# Patient Record
Sex: Female | Born: 1950 | Race: White | Hispanic: No | Smoking: Never smoker
Health system: Southern US, Community
[De-identification: ages and names within clinical notes are randomized; demographics above are authoritative.]

## PROBLEM LIST (undated history)

## (undated) HISTORY — PX: ABDOMINAL HYSTERECTOMY: SHX81

---

## 2004-04-13 ENCOUNTER — Ambulatory Visit: Payer: Self-pay | Admitting: Obstetrics and Gynecology

## 2005-04-27 ENCOUNTER — Ambulatory Visit: Payer: Self-pay | Admitting: Obstetrics and Gynecology

## 2005-08-11 ENCOUNTER — Ambulatory Visit: Payer: Self-pay | Admitting: Gastroenterology

## 2006-05-01 ENCOUNTER — Ambulatory Visit: Payer: Self-pay | Admitting: Obstetrics and Gynecology

## 2007-05-08 ENCOUNTER — Ambulatory Visit: Payer: Self-pay | Admitting: Obstetrics and Gynecology

## 2008-05-13 ENCOUNTER — Ambulatory Visit: Payer: Self-pay | Admitting: Obstetrics and Gynecology

## 2009-05-18 ENCOUNTER — Ambulatory Visit: Payer: Self-pay | Admitting: Obstetrics and Gynecology

## 2010-05-25 ENCOUNTER — Ambulatory Visit: Payer: Self-pay | Admitting: Obstetrics and Gynecology

## 2011-06-15 ENCOUNTER — Ambulatory Visit: Payer: Self-pay | Admitting: Obstetrics and Gynecology

## 2012-06-21 ENCOUNTER — Ambulatory Visit: Payer: Self-pay | Admitting: Obstetrics and Gynecology

## 2013-06-26 ENCOUNTER — Ambulatory Visit: Payer: Self-pay | Admitting: Obstetrics and Gynecology

## 2013-12-13 ENCOUNTER — Ambulatory Visit: Payer: Self-pay | Admitting: Orthopedic Surgery

## 2014-04-03 ENCOUNTER — Ambulatory Visit: Payer: Self-pay | Admitting: Orthopedic Surgery

## 2014-07-01 ENCOUNTER — Ambulatory Visit: Payer: Self-pay | Admitting: Obstetrics and Gynecology

## 2014-10-11 NOTE — Op Note (Signed)
PATIENT NAME:  Vanessa Underwood, Vanessa Underwood MR#:  [id number] DATE OF BIRTH:  08-31-1950  DATE OF PROCEDURE:  04/03/2014  PREOPERATIVE DIAGNOSIS: Painful left wrist volar hardware.   POSTOPERATIVE DIAGNOSIS: Painful left wrist volar hardware.   PROCEDURE: Removal of deep hardware left wrist.   ANESTHESIA: General.   SURGEON: Kennedy BuckerMichael Wynette Jersey, MD.   DESCRIPTION OF PROCEDURE: The patient was brought to the operating room and after adequate anesthesia was obtained the left arm was prepped and draped in the usual sterile fashion. After patient identification and timeout procedures were completed the tourniquet was raised to 250 mmHg. The prior skin incision was opened and the subcutaneous tissues spread. The FCR tendon was identified and retracted radially, protecting the neurovascular bundle. Deep dissection was carried out and the pronator was elevated off the plate. There was a layer of bursitis that was consistent with chronic inflammation overlying the volar radial plate. The proximal screws were removed without difficulty and then the smooth pegs from the distal portion of the plate removed again without any difficulty. The plate was grasped and removed again without difficulty with bursa noted over the plate.  The wound was irrigated and tourniquet let down. The wound was then closed with 4-0 Vicryl subcutaneous and 4-0 nylon for the skin. Xeroform, 4 x 4s, Webril and an Ace wrap applied and the patient was sent to the recovery room in stable condition.   ESTIMATED BLOOD LOSS: Minimal.   COMPLICATIONS: None.   SPECIMEN: None.  The patient did not request hardware and the hardware was intact.   TOURNIQUET TIME: 11 minutes at 250 mmHg.    ____________________________ Leitha SchullerMichael J. Lynasia Meloche, MD mjm:bu D: 04/03/2014 16:44:01 ET T: 04/03/2014 17:16:50 ET JOB#: [id number]  cc: Leitha SchullerMichael J. Ledon Weihe, MD, <Dictator> Leitha SchullerMICHAEL J Anthonee Gelin MD ELECTRONICALLY SIGNED 04/03/2014 22:21

## 2014-10-11 NOTE — Op Note (Signed)
PATIENT NAME:  Vanessa Underwood, Vanessa Underwood MR#:  [id number] DATE OF BIRTH:  1951-01-28  DATE OF PROCEDURE:  12/13/2013  PREOPERATIVE DIAGNOSIS: Comminuted intraarticular fracture of the left distal radius.   POSTOPERATIVE DIAGNOSIS: Comminuted intraarticular fracture of the left distal radius.   PROCEDURE: Open reduction and internal fixation, left distal radius.   ANESTHESIA: General.   SURGEON: Leitha SchullerMichael J. Menz, MD  DESCRIPTION OF PROCEDURE: The patient was brought to the operating room and after adequate anesthesia was obtained, the left arm was prepped and draped in the usual sterile fashion with a tourniquet applied to the upper arm. After appropriate patient identification and timeout procedures were completed, fingertrap traction was applied through the index and middle finger with 9-3/4 pounds of traction applied. The tourniquet was raised.  After inflation of the tourniquet, a volar incision was made in line with the FCR tendon. Hemostasis was achieved with electrocautery. After initial approach, there appeared to be inadequate tourniquet effect, so the tourniquet was raised to 275. This still did not seem to give a good enough effect, so the tourniquet was let down during the initial dissection. The FCR tendon sheath was incised, the tendon retracted radially to protect the radial artery, and the pronator elevated off the distal radius. The exposure was quite comminuted. At this point, the tourniquet was raised back up for better visualization of the fragments. With many C-arm views, the reduction was mostly completed with just traction with flexion. There was a near anatomic reduction.   A short narrow DVR plate was applied and pinned in position to get appropriate rotation and position with regard to being distal enough. Two cortical screws were placed in the proximal shaft to hold the plate in place. With the wrist flexed with traction still applied, the distal peg holes were filled, drilling,  measuring and placing smooth pegs. After all smooth pegs were placed, traction was released. Through range of motion, the fracture appeared stable.   The tourniquet was let down and bleeding was controlled with electrocautery. There was minimal bleeding. The wound was thoroughly irrigated and then closed with 3-0 Vicryl subcutaneously, 4-0 nylon for the skin in a simple interrupted manner. Xeroform, 4 x 4's, Webril, and a volar splint were applied followed by an Ace wrap. Tourniquet time was 23 minutes at 275 mmHg. There were no complications.   SPECIMEN: None.   CONDITION: To the recovery room, stable.   IMPLANT: [employer] left short narrow DVR plate with multiple screws and smooth pegs.    ____________________________ Leitha SchullerMichael J. Menz, MD mjm:ah D: 12/14/2013 09:29:42 ET T: 12/14/2013 20:05:57 ET JOB#: [id number]  cc: Leitha SchullerMichael J. Menz, MD, <Dictator> Leitha SchullerMICHAEL J MENZ MD ELECTRONICALLY SIGNED 12/15/2013 7:49

## 2015-07-07 ENCOUNTER — Other Ambulatory Visit: Payer: Self-pay | Admitting: Obstetrics and Gynecology

## 2015-07-07 DIAGNOSIS — Z01411 Encounter for gynecological examination (general) (routine) with abnormal findings: Secondary | ICD-10-CM | POA: Diagnosis not present

## 2015-07-07 DIAGNOSIS — Z1231 Encounter for screening mammogram for malignant neoplasm of breast: Secondary | ICD-10-CM | POA: Diagnosis not present

## 2015-07-07 DIAGNOSIS — L900 Lichen sclerosus et atrophicus: Secondary | ICD-10-CM | POA: Diagnosis not present

## 2015-07-14 ENCOUNTER — Ambulatory Visit
Admission: RE | Admit: 2015-07-14 | Discharge: 2015-07-14 | Disposition: A | Discharge status: Home / Self Care | Payer: PPO | Source: Ambulatory Visit | Attending: Obstetrics and Gynecology | Admitting: Obstetrics and Gynecology

## 2015-07-14 ENCOUNTER — Other Ambulatory Visit: Payer: Self-pay | Admitting: Obstetrics and Gynecology

## 2015-07-14 DIAGNOSIS — Z1231 Encounter for screening mammogram for malignant neoplasm of breast: Secondary | ICD-10-CM

## 2015-08-06 IMAGING — CR LEFT WRIST - 2 VIEW
1 series · 2 of 2 positions shown · non-contrast
Comparison: None

CLINICAL DATA: Post ORIF

EXAM:
LEFT WRIST - 2 VIEW

[Series 1: pa · 0.17mm/px · 2 of 2 slices shown]
[im 1/2]
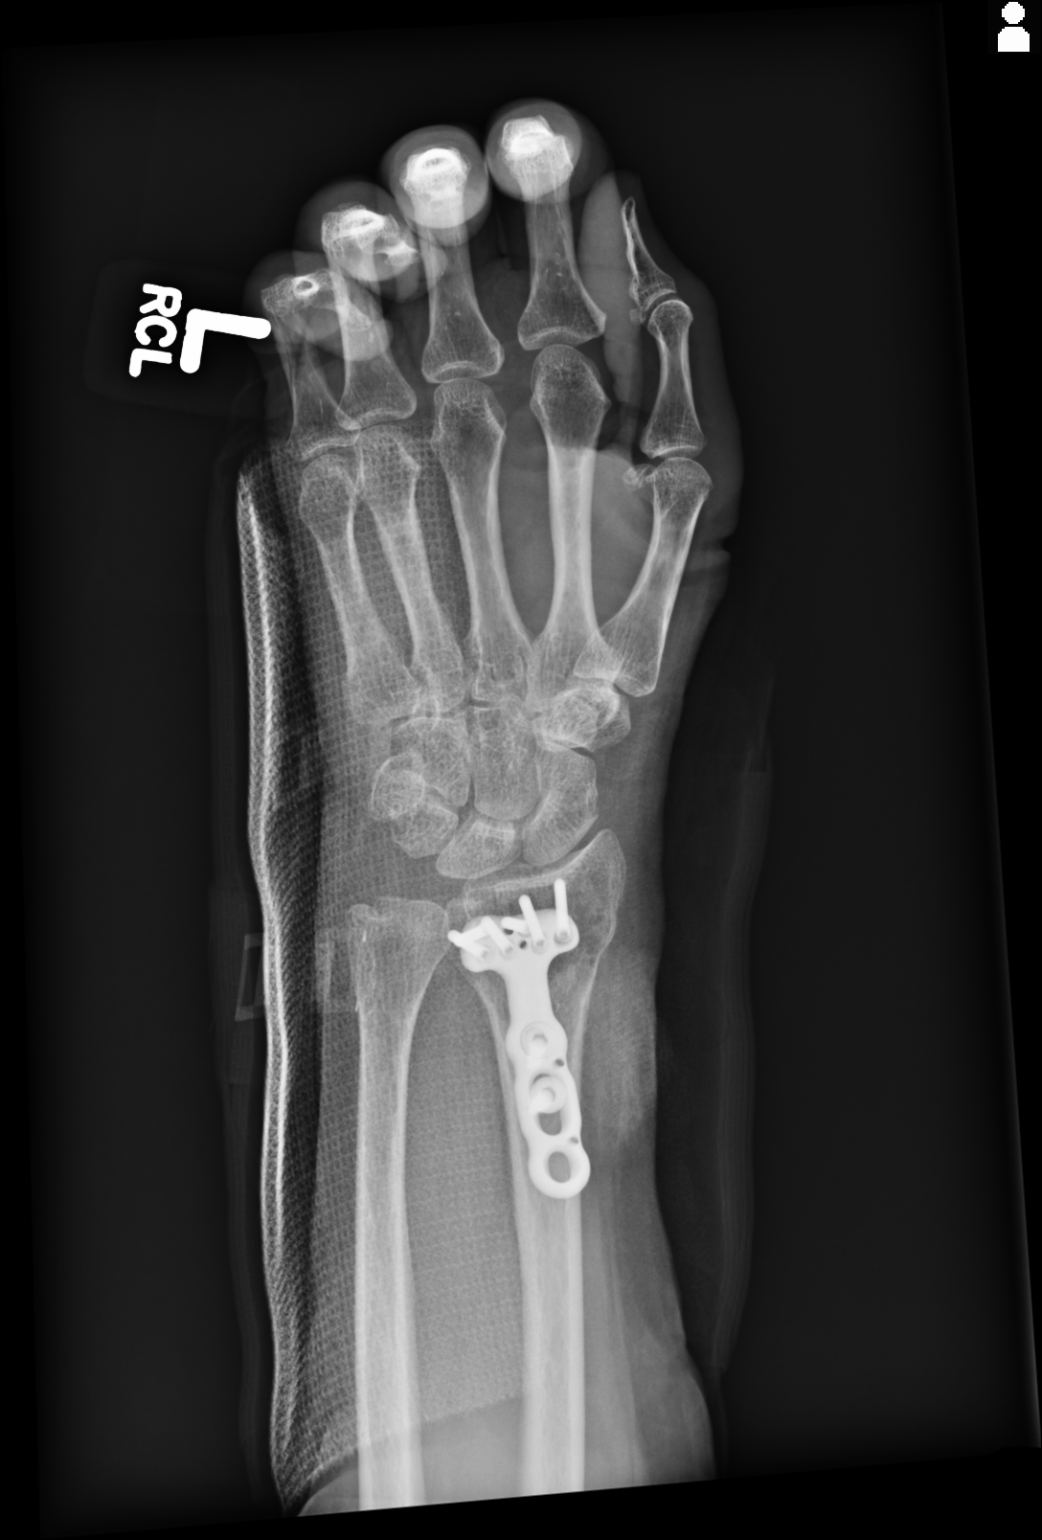
[im 2/2]
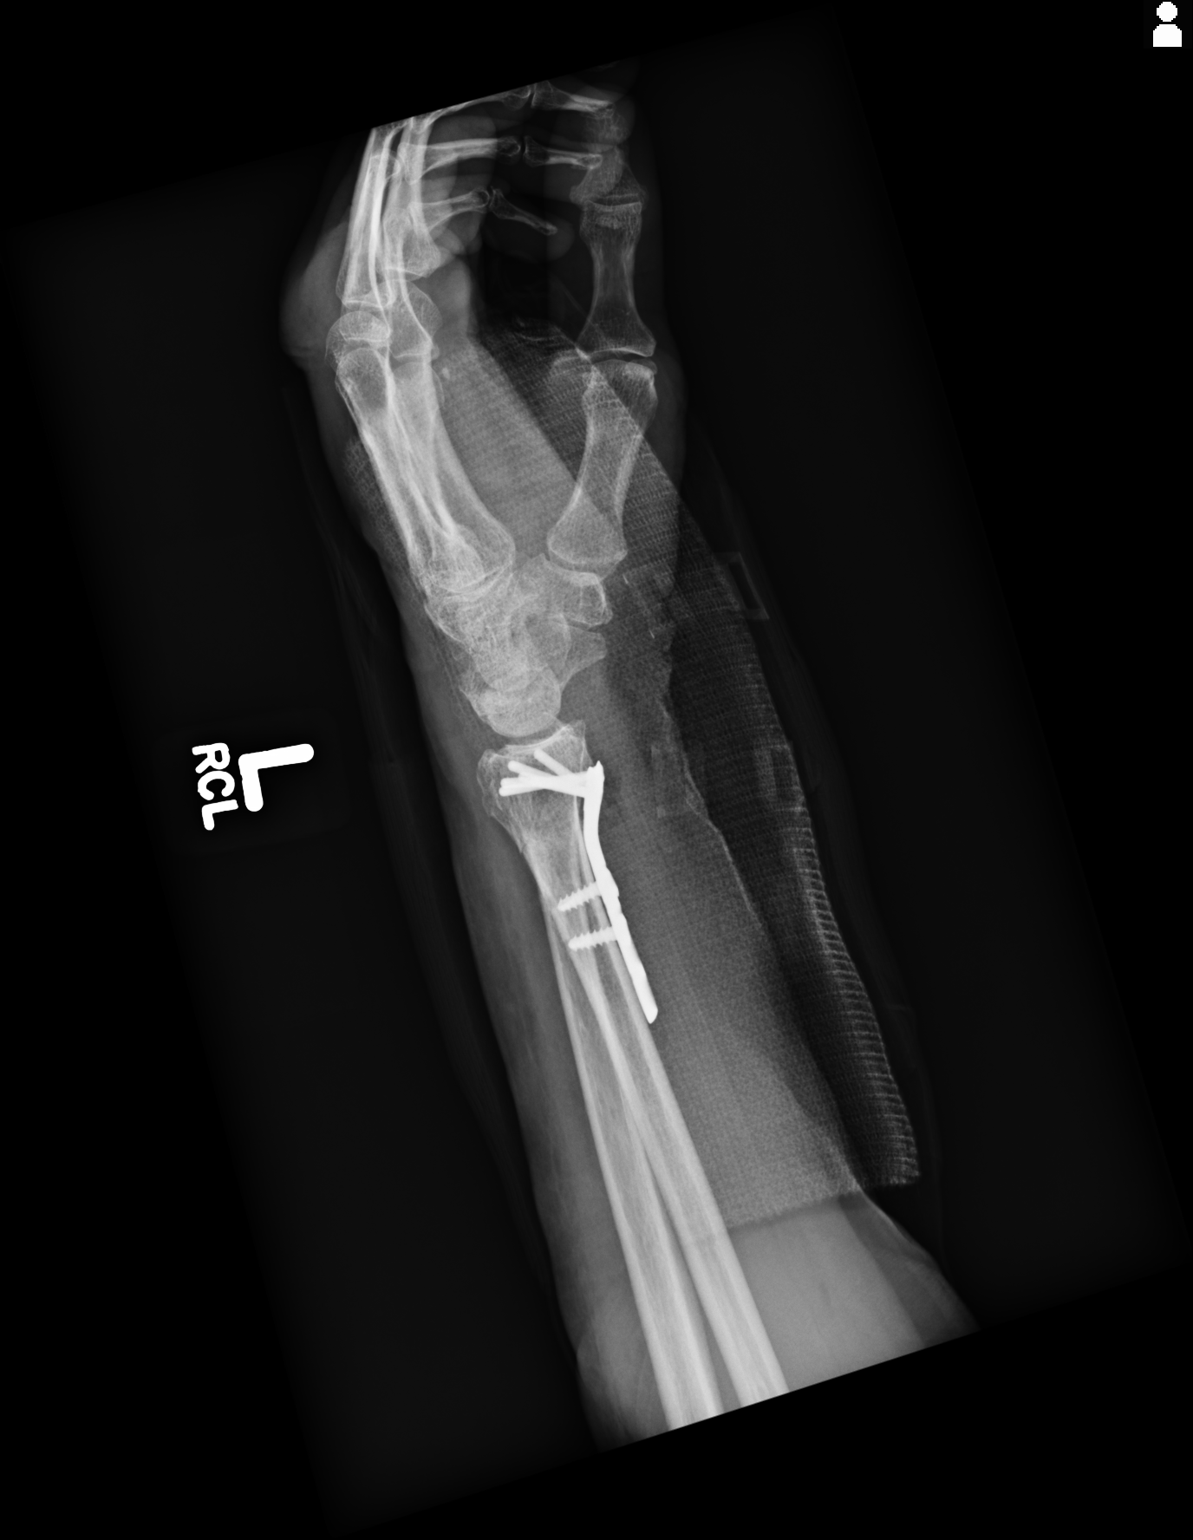

[2 of 2 positions shown; findings below may reference images not displayed]

FINDINGS: No preoperative images for comparison.

Fiberglass splint material obscures bone detail.

Volar plate and multiple screws/pegs at distal LEFT radius post ORIF
of a reduced distal LEFT radial metaphyseal fracture.

Bones appear demineralized.

Joint spaces preserved.

No additional fracture or dislocation seen.
IMPRESSION: Post ORIF of a distal LEFT radial metaphyseal fracture.

## 2015-09-25 DIAGNOSIS — F3342 Major depressive disorder, recurrent, in full remission: Secondary | ICD-10-CM | POA: Diagnosis not present

## 2015-09-30 DIAGNOSIS — E034 Atrophy of thyroid (acquired): Secondary | ICD-10-CM | POA: Diagnosis not present

## 2015-09-30 DIAGNOSIS — E784 Other hyperlipidemia: Secondary | ICD-10-CM | POA: Diagnosis not present

## 2015-09-30 DIAGNOSIS — M150 Primary generalized (osteo)arthritis: Secondary | ICD-10-CM | POA: Diagnosis not present

## 2015-09-30 DIAGNOSIS — R739 Hyperglycemia, unspecified: Secondary | ICD-10-CM | POA: Diagnosis not present

## 2015-09-30 DIAGNOSIS — E559 Vitamin D deficiency, unspecified: Secondary | ICD-10-CM | POA: Diagnosis not present

## 2015-10-07 DIAGNOSIS — K648 Other hemorrhoids: Secondary | ICD-10-CM | POA: Diagnosis not present

## 2015-10-07 DIAGNOSIS — E559 Vitamin D deficiency, unspecified: Secondary | ICD-10-CM | POA: Diagnosis not present

## 2015-10-07 DIAGNOSIS — R739 Hyperglycemia, unspecified: Secondary | ICD-10-CM | POA: Diagnosis not present

## 2015-10-07 DIAGNOSIS — Z1211 Encounter for screening for malignant neoplasm of colon: Secondary | ICD-10-CM | POA: Diagnosis not present

## 2015-10-07 DIAGNOSIS — M150 Primary generalized (osteo)arthritis: Secondary | ICD-10-CM | POA: Diagnosis not present

## 2015-10-07 DIAGNOSIS — E784 Other hyperlipidemia: Secondary | ICD-10-CM | POA: Diagnosis not present

## 2015-10-07 DIAGNOSIS — M810 Age-related osteoporosis without current pathological fracture: Secondary | ICD-10-CM | POA: Diagnosis not present

## 2015-10-07 DIAGNOSIS — Z23 Encounter for immunization: Secondary | ICD-10-CM | POA: Diagnosis not present

## 2015-10-07 DIAGNOSIS — F3342 Major depressive disorder, recurrent, in full remission: Secondary | ICD-10-CM | POA: Diagnosis not present

## 2015-10-07 DIAGNOSIS — E034 Atrophy of thyroid (acquired): Secondary | ICD-10-CM | POA: Diagnosis not present

## 2015-10-15 DIAGNOSIS — M810 Age-related osteoporosis without current pathological fracture: Secondary | ICD-10-CM | POA: Diagnosis not present

## 2015-10-19 DIAGNOSIS — Z1211 Encounter for screening for malignant neoplasm of colon: Secondary | ICD-10-CM | POA: Diagnosis not present

## 2015-10-20 DIAGNOSIS — L900 Lichen sclerosus et atrophicus: Secondary | ICD-10-CM | POA: Diagnosis not present

## 2015-12-24 DIAGNOSIS — L03116 Cellulitis of left lower limb: Secondary | ICD-10-CM | POA: Diagnosis not present

## 2016-01-19 DIAGNOSIS — L900 Lichen sclerosus et atrophicus: Secondary | ICD-10-CM | POA: Diagnosis not present

## 2016-02-12 DIAGNOSIS — R399 Unspecified symptoms and signs involving the genitourinary system: Secondary | ICD-10-CM | POA: Diagnosis not present

## 2016-03-25 DIAGNOSIS — F3342 Major depressive disorder, recurrent, in full remission: Secondary | ICD-10-CM | POA: Diagnosis not present

## 2016-04-05 DIAGNOSIS — K648 Other hemorrhoids: Secondary | ICD-10-CM | POA: Diagnosis not present

## 2016-04-05 DIAGNOSIS — M150 Primary generalized (osteo)arthritis: Secondary | ICD-10-CM | POA: Diagnosis not present

## 2016-04-05 DIAGNOSIS — R739 Hyperglycemia, unspecified: Secondary | ICD-10-CM | POA: Diagnosis not present

## 2016-04-05 DIAGNOSIS — E034 Atrophy of thyroid (acquired): Secondary | ICD-10-CM | POA: Diagnosis not present

## 2016-04-05 DIAGNOSIS — F3342 Major depressive disorder, recurrent, in full remission: Secondary | ICD-10-CM | POA: Diagnosis not present

## 2016-04-05 DIAGNOSIS — E784 Other hyperlipidemia: Secondary | ICD-10-CM | POA: Diagnosis not present

## 2016-04-19 DIAGNOSIS — E034 Atrophy of thyroid (acquired): Secondary | ICD-10-CM | POA: Diagnosis not present

## 2016-04-19 DIAGNOSIS — M810 Age-related osteoporosis without current pathological fracture: Secondary | ICD-10-CM | POA: Diagnosis not present

## 2016-04-19 DIAGNOSIS — R739 Hyperglycemia, unspecified: Secondary | ICD-10-CM | POA: Diagnosis not present

## 2016-04-19 DIAGNOSIS — Z1211 Encounter for screening for malignant neoplasm of colon: Secondary | ICD-10-CM | POA: Diagnosis not present

## 2016-04-19 DIAGNOSIS — E784 Other hyperlipidemia: Secondary | ICD-10-CM | POA: Diagnosis not present

## 2016-04-19 DIAGNOSIS — Z1212 Encounter for screening for malignant neoplasm of rectum: Secondary | ICD-10-CM | POA: Diagnosis not present

## 2016-04-19 DIAGNOSIS — F3342 Major depressive disorder, recurrent, in full remission: Secondary | ICD-10-CM | POA: Diagnosis not present

## 2016-04-19 DIAGNOSIS — M150 Primary generalized (osteo)arthritis: Secondary | ICD-10-CM | POA: Diagnosis not present

## 2016-04-19 DIAGNOSIS — E559 Vitamin D deficiency, unspecified: Secondary | ICD-10-CM | POA: Diagnosis not present

## 2016-04-19 DIAGNOSIS — Z0001 Encounter for general adult medical examination with abnormal findings: Secondary | ICD-10-CM | POA: Diagnosis not present

## 2016-04-19 DIAGNOSIS — Z79899 Other long term (current) drug therapy: Secondary | ICD-10-CM | POA: Diagnosis not present

## 2016-05-02 DIAGNOSIS — Z1212 Encounter for screening for malignant neoplasm of rectum: Secondary | ICD-10-CM | POA: Diagnosis not present

## 2016-05-02 DIAGNOSIS — Z1211 Encounter for screening for malignant neoplasm of colon: Secondary | ICD-10-CM | POA: Diagnosis not present

## 2016-09-30 DIAGNOSIS — F3342 Major depressive disorder, recurrent, in full remission: Secondary | ICD-10-CM | POA: Diagnosis not present

## 2016-10-04 DIAGNOSIS — E559 Vitamin D deficiency, unspecified: Secondary | ICD-10-CM | POA: Diagnosis not present

## 2016-10-04 DIAGNOSIS — E034 Atrophy of thyroid (acquired): Secondary | ICD-10-CM | POA: Diagnosis not present

## 2016-10-04 DIAGNOSIS — E784 Other hyperlipidemia: Secondary | ICD-10-CM | POA: Diagnosis not present

## 2016-10-04 DIAGNOSIS — M150 Primary generalized (osteo)arthritis: Secondary | ICD-10-CM | POA: Diagnosis not present

## 2016-10-04 DIAGNOSIS — R739 Hyperglycemia, unspecified: Secondary | ICD-10-CM | POA: Diagnosis not present

## 2016-10-11 DIAGNOSIS — R739 Hyperglycemia, unspecified: Secondary | ICD-10-CM | POA: Diagnosis not present

## 2016-10-11 DIAGNOSIS — F3342 Major depressive disorder, recurrent, in full remission: Secondary | ICD-10-CM | POA: Diagnosis not present

## 2016-10-11 DIAGNOSIS — Z0000 Encounter for general adult medical examination without abnormal findings: Secondary | ICD-10-CM | POA: Diagnosis not present

## 2016-10-11 DIAGNOSIS — M810 Age-related osteoporosis without current pathological fracture: Secondary | ICD-10-CM | POA: Diagnosis not present

## 2016-10-11 DIAGNOSIS — E559 Vitamin D deficiency, unspecified: Secondary | ICD-10-CM | POA: Diagnosis not present

## 2016-10-11 DIAGNOSIS — Z1212 Encounter for screening for malignant neoplasm of rectum: Secondary | ICD-10-CM | POA: Diagnosis not present

## 2016-10-11 DIAGNOSIS — Z1211 Encounter for screening for malignant neoplasm of colon: Secondary | ICD-10-CM | POA: Diagnosis not present

## 2016-10-11 DIAGNOSIS — E784 Other hyperlipidemia: Secondary | ICD-10-CM | POA: Diagnosis not present

## 2016-10-11 DIAGNOSIS — E034 Atrophy of thyroid (acquired): Secondary | ICD-10-CM | POA: Diagnosis not present

## 2016-10-11 DIAGNOSIS — M150 Primary generalized (osteo)arthritis: Secondary | ICD-10-CM | POA: Diagnosis not present

## 2016-10-19 DIAGNOSIS — Z1211 Encounter for screening for malignant neoplasm of colon: Secondary | ICD-10-CM | POA: Diagnosis not present

## 2016-10-19 DIAGNOSIS — Z1212 Encounter for screening for malignant neoplasm of rectum: Secondary | ICD-10-CM | POA: Diagnosis not present

## 2017-04-07 DIAGNOSIS — F3342 Major depressive disorder, recurrent, in full remission: Secondary | ICD-10-CM | POA: Diagnosis not present

## 2017-04-14 DIAGNOSIS — M150 Primary generalized (osteo)arthritis: Secondary | ICD-10-CM | POA: Diagnosis not present

## 2017-04-14 DIAGNOSIS — R739 Hyperglycemia, unspecified: Secondary | ICD-10-CM | POA: Diagnosis not present

## 2017-04-14 DIAGNOSIS — E034 Atrophy of thyroid (acquired): Secondary | ICD-10-CM | POA: Diagnosis not present

## 2017-04-14 DIAGNOSIS — E7849 Other hyperlipidemia: Secondary | ICD-10-CM | POA: Diagnosis not present

## 2017-04-21 DIAGNOSIS — R739 Hyperglycemia, unspecified: Secondary | ICD-10-CM | POA: Diagnosis not present

## 2017-04-21 DIAGNOSIS — F3342 Major depressive disorder, recurrent, in full remission: Secondary | ICD-10-CM | POA: Diagnosis not present

## 2017-04-21 DIAGNOSIS — E034 Atrophy of thyroid (acquired): Secondary | ICD-10-CM | POA: Diagnosis not present

## 2017-04-21 DIAGNOSIS — E559 Vitamin D deficiency, unspecified: Secondary | ICD-10-CM | POA: Diagnosis not present

## 2017-04-21 DIAGNOSIS — M150 Primary generalized (osteo)arthritis: Secondary | ICD-10-CM | POA: Diagnosis not present

## 2017-04-21 DIAGNOSIS — M810 Age-related osteoporosis without current pathological fracture: Secondary | ICD-10-CM | POA: Diagnosis not present

## 2017-04-21 DIAGNOSIS — E7849 Other hyperlipidemia: Secondary | ICD-10-CM | POA: Diagnosis not present

## 2017-06-06 ENCOUNTER — Other Ambulatory Visit: Payer: Self-pay | Admitting: Obstetrics and Gynecology

## 2017-06-06 DIAGNOSIS — Z01411 Encounter for gynecological examination (general) (routine) with abnormal findings: Secondary | ICD-10-CM | POA: Diagnosis not present

## 2017-06-06 DIAGNOSIS — Z1231 Encounter for screening mammogram for malignant neoplasm of breast: Secondary | ICD-10-CM | POA: Diagnosis not present

## 2017-07-10 ENCOUNTER — Ambulatory Visit
Admission: RE | Admit: 2017-07-10 | Discharge: 2017-07-10 | Disposition: A | Discharge status: Home / Self Care | Payer: PPO | Source: Ambulatory Visit | Attending: Obstetrics and Gynecology | Admitting: Obstetrics and Gynecology

## 2017-07-10 DIAGNOSIS — R928 Other abnormal and inconclusive findings on diagnostic imaging of breast: Secondary | ICD-10-CM | POA: Insufficient documentation

## 2017-07-10 DIAGNOSIS — Z1231 Encounter for screening mammogram for malignant neoplasm of breast: Secondary | ICD-10-CM | POA: Diagnosis not present

## 2017-07-17 ENCOUNTER — Other Ambulatory Visit: Payer: Self-pay | Admitting: Obstetrics and Gynecology

## 2017-07-17 DIAGNOSIS — N6489 Other specified disorders of breast: Secondary | ICD-10-CM

## 2017-07-17 DIAGNOSIS — R928 Other abnormal and inconclusive findings on diagnostic imaging of breast: Secondary | ICD-10-CM

## 2017-09-29 DIAGNOSIS — F3342 Major depressive disorder, recurrent, in full remission: Secondary | ICD-10-CM | POA: Diagnosis not present

## 2017-10-13 DIAGNOSIS — M150 Primary generalized (osteo)arthritis: Secondary | ICD-10-CM | POA: Diagnosis not present

## 2017-10-13 DIAGNOSIS — E559 Vitamin D deficiency, unspecified: Secondary | ICD-10-CM | POA: Diagnosis not present

## 2017-10-13 DIAGNOSIS — M810 Age-related osteoporosis without current pathological fracture: Secondary | ICD-10-CM | POA: Diagnosis not present

## 2017-10-13 DIAGNOSIS — E7849 Other hyperlipidemia: Secondary | ICD-10-CM | POA: Diagnosis not present

## 2017-10-13 DIAGNOSIS — R739 Hyperglycemia, unspecified: Secondary | ICD-10-CM | POA: Diagnosis not present

## 2017-10-20 DIAGNOSIS — E034 Atrophy of thyroid (acquired): Secondary | ICD-10-CM | POA: Diagnosis not present

## 2017-10-20 DIAGNOSIS — E7849 Other hyperlipidemia: Secondary | ICD-10-CM | POA: Diagnosis not present

## 2017-10-20 DIAGNOSIS — E559 Vitamin D deficiency, unspecified: Secondary | ICD-10-CM | POA: Diagnosis not present

## 2017-10-20 DIAGNOSIS — Z1211 Encounter for screening for malignant neoplasm of colon: Secondary | ICD-10-CM | POA: Diagnosis not present

## 2017-10-20 DIAGNOSIS — R739 Hyperglycemia, unspecified: Secondary | ICD-10-CM | POA: Diagnosis not present

## 2017-10-20 DIAGNOSIS — Z0000 Encounter for general adult medical examination without abnormal findings: Secondary | ICD-10-CM | POA: Diagnosis not present

## 2017-10-20 DIAGNOSIS — D72810 Lymphocytopenia: Secondary | ICD-10-CM | POA: Diagnosis not present

## 2017-10-20 DIAGNOSIS — M810 Age-related osteoporosis without current pathological fracture: Secondary | ICD-10-CM | POA: Diagnosis not present

## 2017-10-20 DIAGNOSIS — F3342 Major depressive disorder, recurrent, in full remission: Secondary | ICD-10-CM | POA: Diagnosis not present

## 2017-10-20 DIAGNOSIS — M150 Primary generalized (osteo)arthritis: Secondary | ICD-10-CM | POA: Diagnosis not present

## 2017-10-26 DIAGNOSIS — Z1211 Encounter for screening for malignant neoplasm of colon: Secondary | ICD-10-CM | POA: Diagnosis not present

## 2018-03-23 DIAGNOSIS — F3342 Major depressive disorder, recurrent, in full remission: Secondary | ICD-10-CM | POA: Diagnosis not present

## 2018-04-17 DIAGNOSIS — Z79899 Other long term (current) drug therapy: Secondary | ICD-10-CM | POA: Diagnosis not present

## 2018-04-17 DIAGNOSIS — D72810 Lymphocytopenia: Secondary | ICD-10-CM | POA: Diagnosis not present

## 2018-04-17 DIAGNOSIS — R739 Hyperglycemia, unspecified: Secondary | ICD-10-CM | POA: Diagnosis not present

## 2018-04-17 DIAGNOSIS — E7849 Other hyperlipidemia: Secondary | ICD-10-CM | POA: Diagnosis not present

## 2018-04-24 DIAGNOSIS — E559 Vitamin D deficiency, unspecified: Secondary | ICD-10-CM | POA: Diagnosis not present

## 2018-04-24 DIAGNOSIS — F3342 Major depressive disorder, recurrent, in full remission: Secondary | ICD-10-CM | POA: Diagnosis not present

## 2018-04-24 DIAGNOSIS — M150 Primary generalized (osteo)arthritis: Secondary | ICD-10-CM | POA: Diagnosis not present

## 2018-04-24 DIAGNOSIS — Z79899 Other long term (current) drug therapy: Secondary | ICD-10-CM | POA: Diagnosis not present

## 2018-04-24 DIAGNOSIS — E034 Atrophy of thyroid (acquired): Secondary | ICD-10-CM | POA: Diagnosis not present

## 2018-04-24 DIAGNOSIS — R739 Hyperglycemia, unspecified: Secondary | ICD-10-CM | POA: Diagnosis not present

## 2018-04-24 DIAGNOSIS — M810 Age-related osteoporosis without current pathological fracture: Secondary | ICD-10-CM | POA: Diagnosis not present

## 2018-04-24 DIAGNOSIS — E7849 Other hyperlipidemia: Secondary | ICD-10-CM | POA: Diagnosis not present

## 2018-10-14 DIAGNOSIS — R300 Dysuria: Secondary | ICD-10-CM | POA: Diagnosis not present

## 2018-10-14 DIAGNOSIS — N3001 Acute cystitis with hematuria: Secondary | ICD-10-CM | POA: Diagnosis not present

## 2018-10-23 DIAGNOSIS — D72810 Lymphocytopenia: Secondary | ICD-10-CM | POA: Diagnosis not present

## 2018-10-23 DIAGNOSIS — M810 Age-related osteoporosis without current pathological fracture: Secondary | ICD-10-CM | POA: Diagnosis not present

## 2018-10-23 DIAGNOSIS — Z0000 Encounter for general adult medical examination without abnormal findings: Secondary | ICD-10-CM | POA: Diagnosis not present

## 2018-10-23 DIAGNOSIS — E7849 Other hyperlipidemia: Secondary | ICD-10-CM | POA: Diagnosis not present

## 2018-10-23 DIAGNOSIS — E559 Vitamin D deficiency, unspecified: Secondary | ICD-10-CM | POA: Diagnosis not present

## 2018-10-23 DIAGNOSIS — R739 Hyperglycemia, unspecified: Secondary | ICD-10-CM | POA: Diagnosis not present

## 2018-10-23 DIAGNOSIS — M8949 Other hypertrophic osteoarthropathy, multiple sites: Secondary | ICD-10-CM | POA: Diagnosis not present

## 2018-10-23 DIAGNOSIS — Z1239 Encounter for other screening for malignant neoplasm of breast: Secondary | ICD-10-CM | POA: Diagnosis not present

## 2018-10-23 DIAGNOSIS — F3342 Major depressive disorder, recurrent, in full remission: Secondary | ICD-10-CM | POA: Diagnosis not present

## 2018-10-23 DIAGNOSIS — E034 Atrophy of thyroid (acquired): Secondary | ICD-10-CM | POA: Diagnosis not present

## 2019-02-20 DIAGNOSIS — E034 Atrophy of thyroid (acquired): Secondary | ICD-10-CM | POA: Diagnosis not present

## 2019-02-20 DIAGNOSIS — M8588 Other specified disorders of bone density and structure, other site: Secondary | ICD-10-CM | POA: Diagnosis not present

## 2019-02-20 DIAGNOSIS — E7849 Other hyperlipidemia: Secondary | ICD-10-CM | POA: Diagnosis not present

## 2019-02-20 DIAGNOSIS — D72810 Lymphocytopenia: Secondary | ICD-10-CM | POA: Diagnosis not present

## 2019-02-20 DIAGNOSIS — R739 Hyperglycemia, unspecified: Secondary | ICD-10-CM | POA: Diagnosis not present

## 2019-02-27 DIAGNOSIS — E034 Atrophy of thyroid (acquired): Secondary | ICD-10-CM | POA: Diagnosis not present

## 2019-02-27 DIAGNOSIS — D696 Thrombocytopenia, unspecified: Secondary | ICD-10-CM | POA: Diagnosis not present

## 2019-02-27 DIAGNOSIS — F3342 Major depressive disorder, recurrent, in full remission: Secondary | ICD-10-CM | POA: Diagnosis not present

## 2019-02-27 DIAGNOSIS — E7849 Other hyperlipidemia: Secondary | ICD-10-CM | POA: Diagnosis not present

## 2019-02-27 DIAGNOSIS — M79671 Pain in right foot: Secondary | ICD-10-CM | POA: Diagnosis not present

## 2019-02-27 DIAGNOSIS — R739 Hyperglycemia, unspecified: Secondary | ICD-10-CM | POA: Diagnosis not present

## 2019-02-27 DIAGNOSIS — M7989 Other specified soft tissue disorders: Secondary | ICD-10-CM | POA: Diagnosis not present

## 2019-02-27 DIAGNOSIS — D72819 Decreased white blood cell count, unspecified: Secondary | ICD-10-CM | POA: Diagnosis not present

## 2019-02-27 DIAGNOSIS — M810 Age-related osteoporosis without current pathological fracture: Secondary | ICD-10-CM | POA: Diagnosis not present

## 2019-02-27 DIAGNOSIS — E559 Vitamin D deficiency, unspecified: Secondary | ICD-10-CM | POA: Diagnosis not present

## 2019-03-08 DIAGNOSIS — F333 Major depressive disorder, recurrent, severe with psychotic symptoms: Secondary | ICD-10-CM | POA: Diagnosis not present

## 2019-08-21 DIAGNOSIS — F3342 Major depressive disorder, recurrent, in full remission: Secondary | ICD-10-CM | POA: Diagnosis not present

## 2019-08-21 DIAGNOSIS — E7849 Other hyperlipidemia: Secondary | ICD-10-CM | POA: Diagnosis not present

## 2019-08-21 DIAGNOSIS — D72819 Decreased white blood cell count, unspecified: Secondary | ICD-10-CM | POA: Diagnosis not present

## 2019-08-21 DIAGNOSIS — R739 Hyperglycemia, unspecified: Secondary | ICD-10-CM | POA: Diagnosis not present

## 2019-08-21 DIAGNOSIS — M79671 Pain in right foot: Secondary | ICD-10-CM | POA: Diagnosis not present

## 2019-08-28 DIAGNOSIS — E559 Vitamin D deficiency, unspecified: Secondary | ICD-10-CM | POA: Diagnosis not present

## 2019-08-28 DIAGNOSIS — F3342 Major depressive disorder, recurrent, in full remission: Secondary | ICD-10-CM | POA: Diagnosis not present

## 2019-08-28 DIAGNOSIS — R739 Hyperglycemia, unspecified: Secondary | ICD-10-CM | POA: Diagnosis not present

## 2019-08-28 DIAGNOSIS — D696 Thrombocytopenia, unspecified: Secondary | ICD-10-CM | POA: Diagnosis not present

## 2019-08-28 DIAGNOSIS — M810 Age-related osteoporosis without current pathological fracture: Secondary | ICD-10-CM | POA: Diagnosis not present

## 2019-08-28 DIAGNOSIS — E034 Atrophy of thyroid (acquired): Secondary | ICD-10-CM | POA: Diagnosis not present

## 2019-08-28 DIAGNOSIS — E7849 Other hyperlipidemia: Secondary | ICD-10-CM | POA: Diagnosis not present

## 2019-08-28 DIAGNOSIS — M8949 Other hypertrophic osteoarthropathy, multiple sites: Secondary | ICD-10-CM | POA: Diagnosis not present

## 2019-08-28 DIAGNOSIS — D72819 Decreased white blood cell count, unspecified: Secondary | ICD-10-CM | POA: Diagnosis not present

## 2019-11-13 DIAGNOSIS — F333 Major depressive disorder, recurrent, severe with psychotic symptoms: Secondary | ICD-10-CM | POA: Diagnosis not present

## 2019-11-27 DIAGNOSIS — M810 Age-related osteoporosis without current pathological fracture: Secondary | ICD-10-CM | POA: Diagnosis not present

## 2019-11-27 DIAGNOSIS — R739 Hyperglycemia, unspecified: Secondary | ICD-10-CM | POA: Diagnosis not present

## 2019-11-27 DIAGNOSIS — E559 Vitamin D deficiency, unspecified: Secondary | ICD-10-CM | POA: Diagnosis not present

## 2019-11-27 DIAGNOSIS — E034 Atrophy of thyroid (acquired): Secondary | ICD-10-CM | POA: Diagnosis not present

## 2019-11-27 DIAGNOSIS — E7849 Other hyperlipidemia: Secondary | ICD-10-CM | POA: Diagnosis not present

## 2019-11-27 DIAGNOSIS — D696 Thrombocytopenia, unspecified: Secondary | ICD-10-CM | POA: Diagnosis not present

## 2019-11-27 DIAGNOSIS — D72819 Decreased white blood cell count, unspecified: Secondary | ICD-10-CM | POA: Diagnosis not present

## 2019-11-28 DIAGNOSIS — R300 Dysuria: Secondary | ICD-10-CM | POA: Diagnosis not present

## 2019-12-04 DIAGNOSIS — E034 Atrophy of thyroid (acquired): Secondary | ICD-10-CM | POA: Diagnosis not present

## 2019-12-04 DIAGNOSIS — Z0000 Encounter for general adult medical examination without abnormal findings: Secondary | ICD-10-CM | POA: Diagnosis not present

## 2019-12-04 DIAGNOSIS — M8949 Other hypertrophic osteoarthropathy, multiple sites: Secondary | ICD-10-CM | POA: Diagnosis not present

## 2019-12-04 DIAGNOSIS — B351 Tinea unguium: Secondary | ICD-10-CM | POA: Diagnosis not present

## 2019-12-04 DIAGNOSIS — F3342 Major depressive disorder, recurrent, in full remission: Secondary | ICD-10-CM | POA: Diagnosis not present

## 2019-12-04 DIAGNOSIS — R739 Hyperglycemia, unspecified: Secondary | ICD-10-CM | POA: Diagnosis not present

## 2019-12-04 DIAGNOSIS — Z79899 Other long term (current) drug therapy: Secondary | ICD-10-CM | POA: Diagnosis not present

## 2019-12-04 DIAGNOSIS — D696 Thrombocytopenia, unspecified: Secondary | ICD-10-CM | POA: Diagnosis not present

## 2019-12-04 DIAGNOSIS — E7849 Other hyperlipidemia: Secondary | ICD-10-CM | POA: Diagnosis not present

## 2019-12-04 DIAGNOSIS — M79609 Pain in unspecified limb: Secondary | ICD-10-CM | POA: Diagnosis not present

## 2019-12-04 DIAGNOSIS — E559 Vitamin D deficiency, unspecified: Secondary | ICD-10-CM | POA: Diagnosis not present

## 2020-05-08 DIAGNOSIS — F333 Major depressive disorder, recurrent, severe with psychotic symptoms: Secondary | ICD-10-CM | POA: Diagnosis not present

## 2020-05-29 DIAGNOSIS — E7849 Other hyperlipidemia: Secondary | ICD-10-CM | POA: Diagnosis not present

## 2020-05-29 DIAGNOSIS — R739 Hyperglycemia, unspecified: Secondary | ICD-10-CM | POA: Diagnosis not present

## 2020-05-29 DIAGNOSIS — D696 Thrombocytopenia, unspecified: Secondary | ICD-10-CM | POA: Diagnosis not present

## 2020-06-05 DIAGNOSIS — E559 Vitamin D deficiency, unspecified: Secondary | ICD-10-CM | POA: Diagnosis not present

## 2020-06-05 DIAGNOSIS — R739 Hyperglycemia, unspecified: Secondary | ICD-10-CM | POA: Diagnosis not present

## 2020-06-05 DIAGNOSIS — E034 Atrophy of thyroid (acquired): Secondary | ICD-10-CM | POA: Diagnosis not present

## 2020-06-05 DIAGNOSIS — Z1211 Encounter for screening for malignant neoplasm of colon: Secondary | ICD-10-CM | POA: Diagnosis not present

## 2020-06-05 DIAGNOSIS — F3342 Major depressive disorder, recurrent, in full remission: Secondary | ICD-10-CM | POA: Diagnosis not present

## 2020-06-05 DIAGNOSIS — M810 Age-related osteoporosis without current pathological fracture: Secondary | ICD-10-CM | POA: Diagnosis not present

## 2020-06-05 DIAGNOSIS — D696 Thrombocytopenia, unspecified: Secondary | ICD-10-CM | POA: Diagnosis not present

## 2020-06-05 DIAGNOSIS — E7849 Other hyperlipidemia: Secondary | ICD-10-CM | POA: Diagnosis not present

## 2020-11-13 DIAGNOSIS — F333 Major depressive disorder, recurrent, severe with psychotic symptoms: Secondary | ICD-10-CM | POA: Diagnosis not present

## 2020-12-01 DIAGNOSIS — D696 Thrombocytopenia, unspecified: Secondary | ICD-10-CM | POA: Diagnosis not present

## 2020-12-01 DIAGNOSIS — E559 Vitamin D deficiency, unspecified: Secondary | ICD-10-CM | POA: Diagnosis not present

## 2020-12-01 DIAGNOSIS — R739 Hyperglycemia, unspecified: Secondary | ICD-10-CM | POA: Diagnosis not present

## 2020-12-01 DIAGNOSIS — E7849 Other hyperlipidemia: Secondary | ICD-10-CM | POA: Diagnosis not present

## 2020-12-08 DIAGNOSIS — F3342 Major depressive disorder, recurrent, in full remission: Secondary | ICD-10-CM | POA: Diagnosis not present

## 2020-12-08 DIAGNOSIS — E034 Atrophy of thyroid (acquired): Secondary | ICD-10-CM | POA: Diagnosis not present

## 2020-12-08 DIAGNOSIS — E559 Vitamin D deficiency, unspecified: Secondary | ICD-10-CM | POA: Diagnosis not present

## 2020-12-08 DIAGNOSIS — D696 Thrombocytopenia, unspecified: Secondary | ICD-10-CM | POA: Diagnosis not present

## 2020-12-08 DIAGNOSIS — M810 Age-related osteoporosis without current pathological fracture: Secondary | ICD-10-CM | POA: Diagnosis not present

## 2020-12-08 DIAGNOSIS — E7849 Other hyperlipidemia: Secondary | ICD-10-CM | POA: Diagnosis not present

## 2020-12-08 DIAGNOSIS — R739 Hyperglycemia, unspecified: Secondary | ICD-10-CM | POA: Diagnosis not present

## 2020-12-08 DIAGNOSIS — Z0000 Encounter for general adult medical examination without abnormal findings: Secondary | ICD-10-CM | POA: Diagnosis not present

## 2020-12-30 DIAGNOSIS — M778 Other enthesopathies, not elsewhere classified: Secondary | ICD-10-CM | POA: Diagnosis not present

## 2020-12-30 DIAGNOSIS — M79674 Pain in right toe(s): Secondary | ICD-10-CM | POA: Diagnosis not present

## 2020-12-30 DIAGNOSIS — B351 Tinea unguium: Secondary | ICD-10-CM | POA: Diagnosis not present

## 2020-12-30 DIAGNOSIS — M79675 Pain in left toe(s): Secondary | ICD-10-CM | POA: Diagnosis not present

## 2020-12-30 DIAGNOSIS — L600 Ingrowing nail: Secondary | ICD-10-CM | POA: Diagnosis not present

## 2021-06-02 DIAGNOSIS — E7849 Other hyperlipidemia: Secondary | ICD-10-CM | POA: Diagnosis not present

## 2021-06-02 DIAGNOSIS — D696 Thrombocytopenia, unspecified: Secondary | ICD-10-CM | POA: Diagnosis not present

## 2021-06-02 DIAGNOSIS — E034 Atrophy of thyroid (acquired): Secondary | ICD-10-CM | POA: Diagnosis not present

## 2021-06-02 DIAGNOSIS — R739 Hyperglycemia, unspecified: Secondary | ICD-10-CM | POA: Diagnosis not present

## 2021-06-16 DIAGNOSIS — D696 Thrombocytopenia, unspecified: Secondary | ICD-10-CM | POA: Diagnosis not present

## 2021-06-16 DIAGNOSIS — Z1211 Encounter for screening for malignant neoplasm of colon: Secondary | ICD-10-CM | POA: Diagnosis not present

## 2021-06-16 DIAGNOSIS — F3342 Major depressive disorder, recurrent, in full remission: Secondary | ICD-10-CM | POA: Diagnosis not present

## 2021-06-16 DIAGNOSIS — E7849 Other hyperlipidemia: Secondary | ICD-10-CM | POA: Diagnosis not present

## 2021-06-16 DIAGNOSIS — E559 Vitamin D deficiency, unspecified: Secondary | ICD-10-CM | POA: Diagnosis not present

## 2021-06-16 DIAGNOSIS — N904 Leukoplakia of vulva: Secondary | ICD-10-CM | POA: Diagnosis not present

## 2021-06-16 DIAGNOSIS — M810 Age-related osteoporosis without current pathological fracture: Secondary | ICD-10-CM | POA: Diagnosis not present

## 2021-06-16 DIAGNOSIS — R739 Hyperglycemia, unspecified: Secondary | ICD-10-CM | POA: Diagnosis not present

## 2021-06-16 DIAGNOSIS — E034 Atrophy of thyroid (acquired): Secondary | ICD-10-CM | POA: Diagnosis not present

## 2021-06-29 DIAGNOSIS — Z1211 Encounter for screening for malignant neoplasm of colon: Secondary | ICD-10-CM | POA: Diagnosis not present

## 2021-12-09 DIAGNOSIS — R739 Hyperglycemia, unspecified: Secondary | ICD-10-CM | POA: Diagnosis not present

## 2021-12-09 DIAGNOSIS — D696 Thrombocytopenia, unspecified: Secondary | ICD-10-CM | POA: Diagnosis not present

## 2021-12-09 DIAGNOSIS — E034 Atrophy of thyroid (acquired): Secondary | ICD-10-CM | POA: Diagnosis not present

## 2021-12-09 DIAGNOSIS — E7849 Other hyperlipidemia: Secondary | ICD-10-CM | POA: Diagnosis not present

## 2021-12-16 DIAGNOSIS — E559 Vitamin D deficiency, unspecified: Secondary | ICD-10-CM | POA: Diagnosis not present

## 2021-12-16 DIAGNOSIS — M810 Age-related osteoporosis without current pathological fracture: Secondary | ICD-10-CM | POA: Diagnosis not present

## 2021-12-16 DIAGNOSIS — Z0000 Encounter for general adult medical examination without abnormal findings: Secondary | ICD-10-CM | POA: Diagnosis not present

## 2021-12-16 DIAGNOSIS — Z1211 Encounter for screening for malignant neoplasm of colon: Secondary | ICD-10-CM | POA: Diagnosis not present

## 2021-12-16 DIAGNOSIS — R7303 Prediabetes: Secondary | ICD-10-CM | POA: Diagnosis not present

## 2021-12-16 DIAGNOSIS — Z780 Asymptomatic menopausal state: Secondary | ICD-10-CM | POA: Diagnosis not present

## 2021-12-16 DIAGNOSIS — E7849 Other hyperlipidemia: Secondary | ICD-10-CM | POA: Diagnosis not present

## 2021-12-16 DIAGNOSIS — F3342 Major depressive disorder, recurrent, in full remission: Secondary | ICD-10-CM | POA: Diagnosis not present

## 2021-12-16 DIAGNOSIS — D696 Thrombocytopenia, unspecified: Secondary | ICD-10-CM | POA: Diagnosis not present

## 2021-12-16 DIAGNOSIS — E034 Atrophy of thyroid (acquired): Secondary | ICD-10-CM | POA: Diagnosis not present

## 2021-12-29 DIAGNOSIS — Z1211 Encounter for screening for malignant neoplasm of colon: Secondary | ICD-10-CM | POA: Diagnosis not present

## 2022-01-06 DIAGNOSIS — M810 Age-related osteoporosis without current pathological fracture: Secondary | ICD-10-CM | POA: Diagnosis not present

## 2022-03-11 DIAGNOSIS — H25013 Cortical age-related cataract, bilateral: Secondary | ICD-10-CM | POA: Diagnosis not present

## 2022-06-10 DIAGNOSIS — E7849 Other hyperlipidemia: Secondary | ICD-10-CM | POA: Diagnosis not present

## 2022-06-10 DIAGNOSIS — R7303 Prediabetes: Secondary | ICD-10-CM | POA: Diagnosis not present

## 2022-06-10 DIAGNOSIS — D696 Thrombocytopenia, unspecified: Secondary | ICD-10-CM | POA: Diagnosis not present

## 2022-06-10 DIAGNOSIS — E034 Atrophy of thyroid (acquired): Secondary | ICD-10-CM | POA: Diagnosis not present

## 2022-06-17 DIAGNOSIS — F3342 Major depressive disorder, recurrent, in full remission: Secondary | ICD-10-CM | POA: Diagnosis not present

## 2022-06-17 DIAGNOSIS — E034 Atrophy of thyroid (acquired): Secondary | ICD-10-CM | POA: Diagnosis not present

## 2022-06-17 DIAGNOSIS — E7849 Other hyperlipidemia: Secondary | ICD-10-CM | POA: Diagnosis not present

## 2022-06-17 DIAGNOSIS — D696 Thrombocytopenia, unspecified: Secondary | ICD-10-CM | POA: Diagnosis not present

## 2022-06-17 DIAGNOSIS — R7303 Prediabetes: Secondary | ICD-10-CM | POA: Diagnosis not present

## 2022-06-17 DIAGNOSIS — D649 Anemia, unspecified: Secondary | ICD-10-CM | POA: Diagnosis not present

## 2022-06-17 DIAGNOSIS — E559 Vitamin D deficiency, unspecified: Secondary | ICD-10-CM | POA: Diagnosis not present

## 2022-06-17 DIAGNOSIS — M810 Age-related osteoporosis without current pathological fracture: Secondary | ICD-10-CM | POA: Diagnosis not present

## 2022-06-22 DIAGNOSIS — D649 Anemia, unspecified: Secondary | ICD-10-CM | POA: Diagnosis not present

## 2022-06-22 DIAGNOSIS — D696 Thrombocytopenia, unspecified: Secondary | ICD-10-CM | POA: Diagnosis not present

## 2022-07-07 ENCOUNTER — Emergency Department: Payer: PPO

## 2022-07-07 ENCOUNTER — Emergency Department
Admission: EM | Admit: 2022-07-07 | Discharge: 2022-07-08 | Disposition: A | Discharge status: Home / Self Care | Payer: PPO | Attending: Emergency Medicine | Admitting: Emergency Medicine

## 2022-07-07 ENCOUNTER — Encounter: Payer: Self-pay | Admitting: *Deleted

## 2022-07-07 ENCOUNTER — Other Ambulatory Visit: Payer: Self-pay

## 2022-07-07 DIAGNOSIS — S52502A Unspecified fracture of the lower end of left radius, initial encounter for closed fracture: Secondary | ICD-10-CM | POA: Insufficient documentation

## 2022-07-07 DIAGNOSIS — S0993XA Unspecified injury of face, initial encounter: Secondary | ICD-10-CM | POA: Diagnosis not present

## 2022-07-07 DIAGNOSIS — Y92480 Sidewalk as the place of occurrence of the external cause: Secondary | ICD-10-CM | POA: Diagnosis not present

## 2022-07-07 DIAGNOSIS — S0990XA Unspecified injury of head, initial encounter: Secondary | ICD-10-CM | POA: Diagnosis not present

## 2022-07-07 DIAGNOSIS — Z23 Encounter for immunization: Secondary | ICD-10-CM | POA: Insufficient documentation

## 2022-07-07 DIAGNOSIS — W01198A Fall on same level from slipping, tripping and stumbling with subsequent striking against other object, initial encounter: Secondary | ICD-10-CM | POA: Insufficient documentation

## 2022-07-07 DIAGNOSIS — S0181XA Laceration without foreign body of other part of head, initial encounter: Secondary | ICD-10-CM | POA: Insufficient documentation

## 2022-07-07 DIAGNOSIS — E039 Hypothyroidism, unspecified: Secondary | ICD-10-CM | POA: Insufficient documentation

## 2022-07-07 DIAGNOSIS — M25532 Pain in left wrist: Secondary | ICD-10-CM | POA: Diagnosis not present

## 2022-07-07 DIAGNOSIS — Y990 Civilian activity done for income or pay: Secondary | ICD-10-CM | POA: Diagnosis not present

## 2022-07-07 DIAGNOSIS — S199XXA Unspecified injury of neck, initial encounter: Secondary | ICD-10-CM | POA: Diagnosis not present

## 2022-07-07 DIAGNOSIS — S6992XA Unspecified injury of left wrist, hand and finger(s), initial encounter: Secondary | ICD-10-CM | POA: Diagnosis present

## 2022-07-07 MED ORDER — TETANUS-DIPHTH-ACELL PERTUSSIS 5-2.5-18.5 LF-MCG/0.5 IM SUSY
0.5000 mL | PREFILLED_SYRINGE | Freq: Once | INTRAMUSCULAR | Status: AC
  Administered 2022-07-07: 0.5 mL via INTRAMUSCULAR
  Filled 2022-07-07: qty 0.5

## 2022-07-07 MED ORDER — TETANUS-DIPHTHERIA TOXOIDS TD 5-2 LFU IM INJ
0.5000 mL | INJECTION | Freq: Once | INTRAMUSCULAR | Status: DC
  Filled 2022-07-07: qty 0.5

## 2022-07-07 MED ORDER — LIDOCAINE-EPINEPHRINE (PF) 2 %-1:200000 IJ SOLN
INTRAMUSCULAR | Status: AC
  Administered 2022-07-07: 20 mL
  Filled 2022-07-07: qty 20

## 2022-07-07 NOTE — Discharge Instructions (Signed)
Please call and schedule a follow up with primary care to have the sutures removed in 5-7 days.  Call to schedule an appointment with orthopedics for follow up of wrist injury.  If any symptoms change or worsen and you are unable to schedule an appointment, please return to the emergency department.

## 2022-07-07 NOTE — ED Provider Notes (Signed)
[facility] Provider Note    Event Date/Time   First MD Initiated Contact with Patient 07/07/22 2101     (approximate)   History   Fall   HPI  Vanessa Underwood is a 72 y.o. female with history of hypothyroidism, hyperglycemia, hyperlipidemia and remaining as listed in the EMR presents to the emergency department for treatment and evaluation after mechanical, nonsyncopal fall today while at work.  She was carrying too much and fell on the sidewalk.  She reached out with her left hand to try and catch herself and struck her face on the concrete.  She has left wrist pain and pain to her upper lip.  She denies loss of consciousness.  She denies jaw pain, biting her tongue, or dental injury.  She has a puncture wound at the apex of her chin with bleeding controlled by pressure.  Unsure of her last Tdap.     Physical Exam   Triage Vital Signs: ED Triage Vitals  Enc Vitals Group     BP 07/07/22 2028 (!) 140/79     Pulse Rate 07/07/22 2028 90     Resp 07/07/22 2028 18     Temp 07/07/22 2028 (!) 97 F (36.1 C)     Temp Source 07/07/22 2028 Oral     SpO2 07/07/22 2028 99 %     Weight 07/07/22 2029 110 lb (49.9 kg)     Height 07/07/22 2029 5\' 2"  (1.575 m)     Head Circumference --      Peak Flow --      Pain Score 07/07/22 2029 5     Pain Loc --      Pain Edu? --      Excl. in [place]? --     Most recent vital signs: Vitals:   07/07/22 2028  BP: (!) 140/79  Pulse: 90  Resp: 18  Temp: (!) 97 F (36.1 C)  SpO2: 99%     General: Awake, no distress.  CV:  Good peripheral perfusion.  Resp:  Normal effort.  Abd:  No distention.  Other:  55mm laceration/puncture wound to the apex of the chin with arterial bleeding.   ED Results / Procedures / Treatments   Labs (all labs ordered are listed, but only abnormal results are displayed) Labs Reviewed - No data to display   EKG  Not indicated.   RADIOLOGY  CT of the head, maxillofacial bones, and  cervical spine negative for acute findings.  Image of the left wrist shows a nondisplaced, intra-articular radial fracture.  Images reviewed and interpreted by me.  Radiology report consistent with the same.  PROCEDURES:  Critical Care performed: No  ..Laceration Repair  Date/Time: 07/07/2022 11:21 PM  Performed by: Victorino Dike, FNP Authorized by: Victorino Dike, FNP   Consent:    Consent obtained:  Verbal   Consent given by:  Patient   Risks discussed:  Infection and poor cosmetic result Anesthesia:    Anesthesia method:  Local infiltration   Local anesthetic:  Lidocaine 2% WITH epi Laceration details:    Location:  Face   Face location:  Chin   Length (cm):  0.3 Exploration:    Hemostasis achieved with:  Epinephrine, direct pressure and tied off vessels   Wound extent: vascular damage     Wound extent: no underlying fracture   Treatment:    Area cleansed with:  Chlorhexidine and saline   Irrigation method:  Syringe Skin repair:  Repair method:  Sutures   Suture size:  6-0   Suture material:  Prolene   Suture technique:  Simple interrupted   Number of sutures:  2 Approximation:    Approximation:  Close Repair type:    Repair type:  Intermediate Post-procedure details:    Dressing:  Sterile dressing   Procedure completion:  Tolerated well, no immediate complications    MEDICATIONS ORDERED IN ED: Medications  Tdap (BOOSTRIX) injection 0.5 mL (has no administration in time range)  lidocaine-EPINEPHrine (XYLOCAINE W/EPI) 2 %-1:200000 (PF) injection (has no administration in time range)     IMPRESSION / MDM / ASSESSMENT AND PLAN / ED COURSE  I reviewed the triage vital signs and the nursing notes.                              Differential diagnosis includes, but is not limited to, minor head injury,'s goal fracture, maxillofacial bone fracture, dental injury, puncture wound, cervical spine injury, wrist sprain, wrist fracture  Patient's presentation  is most consistent with acute presentation with potential threat to life or bodily function.  72 year old female presenting to the emergency department for treatment and evaluation after mechanical, nonsyncopal fall at work tonight.  See HPI for further details.  Imaging obtained of the head, maxillofacial bones, and cervical spine.  All are reassuring.  She does have a questionable nondisplaced intra-articular fracture of the left radius.  Patient states that she has fractured this wrist in the past and was repaired by Dr. Rudene Christians.  She is left-hand dominant.  Plan will be to place her in a volar OCL and have her follow-up with either Dr. Rudene Christians who she has seen in the past or Dr. Harlow Mares who is the unassigned orthopedist on-call.  Wound to the chin with small arterial bleed did not respond to injection of lidocaine with epinephrine.  2 sutures were required to achieve, hemostasis.  Wound was then monitored for several minutes without return of bleeding. Home care discussed. Patient was advised to follow-up with her primary care provider in 5 to 7 days for suture removal.  ER return precautions discussed.     FINAL CLINICAL IMPRESSION(S) / ED DIAGNOSES   Final diagnoses:  Closed fracture of distal end of left radius, unspecified fracture morphology, initial encounter  Laceration of chin with complication, initial encounter     Rx / DC Orders   ED Discharge Orders     None        Note:  This document was prepared using Dragon voice recognition software and may include unintentional dictation errors.   Victorino Dike, FNP 07/07/22 2328    Lavonia Drafts, MD 07/10/22 1054

## 2022-07-07 NOTE — ED Triage Notes (Signed)
Pt fell at work tonight outside on the side walk.  Struck face, puncture wound to chin and has left wrist pain.  Bleeding controlled to chin.  Abrasion beneath nose.   No  loc  no vomiting   no back or neck pain.  Pt alert  speech clear.

## 2022-07-08 NOTE — ED Notes (Signed)
E signature pad not working. Pt educated on discharge instructions and verbalized understanding.  

## 2022-07-13 ENCOUNTER — Telehealth: Payer: Self-pay

## 2022-07-13 NOTE — Telephone Encounter (Signed)
     Patient  visit on 07/08/2022  at [facility] was for Closed fracture of distal end of left radius, unspecified fracture morphology, initial encounter.  Have you been able to follow up with your primary care physician? Appointment on 07/14/22.  The patient was or was not able to obtain any needed medicine or equipment. No medication prescribed.  Are there diet recommendations that you are having difficulty following? No  Patient expresses understanding of discharge instructions and education provided has no other needs at this time.    [place] Resource Care Guide   [email or web address]  ?? [phone]   [email or web address]: [email or web address]  [email or web address]

## 2022-08-22 DIAGNOSIS — Z03818 Encounter for observation for suspected exposure to other biological agents ruled out: Secondary | ICD-10-CM | POA: Diagnosis not present

## 2022-11-03 DIAGNOSIS — F3342 Major depressive disorder, recurrent, in full remission: Secondary | ICD-10-CM | POA: Diagnosis not present

## 2022-11-03 DIAGNOSIS — D696 Thrombocytopenia, unspecified: Secondary | ICD-10-CM | POA: Diagnosis not present

## 2022-11-03 DIAGNOSIS — J029 Acute pharyngitis, unspecified: Secondary | ICD-10-CM | POA: Diagnosis not present

## 2022-11-03 DIAGNOSIS — J069 Acute upper respiratory infection, unspecified: Secondary | ICD-10-CM | POA: Diagnosis not present

## 2022-12-14 DIAGNOSIS — R7303 Prediabetes: Secondary | ICD-10-CM | POA: Diagnosis not present

## 2022-12-14 DIAGNOSIS — D696 Thrombocytopenia, unspecified: Secondary | ICD-10-CM | POA: Diagnosis not present

## 2022-12-14 DIAGNOSIS — E034 Atrophy of thyroid (acquired): Secondary | ICD-10-CM | POA: Diagnosis not present

## 2022-12-14 DIAGNOSIS — E7849 Other hyperlipidemia: Secondary | ICD-10-CM | POA: Diagnosis not present

## 2022-12-14 DIAGNOSIS — D649 Anemia, unspecified: Secondary | ICD-10-CM | POA: Diagnosis not present

## 2022-12-20 DIAGNOSIS — E034 Atrophy of thyroid (acquired): Secondary | ICD-10-CM | POA: Diagnosis not present

## 2022-12-20 DIAGNOSIS — E7849 Other hyperlipidemia: Secondary | ICD-10-CM | POA: Diagnosis not present

## 2022-12-20 DIAGNOSIS — R7303 Prediabetes: Secondary | ICD-10-CM | POA: Diagnosis not present

## 2022-12-20 DIAGNOSIS — F3342 Major depressive disorder, recurrent, in full remission: Secondary | ICD-10-CM | POA: Diagnosis not present

## 2022-12-20 DIAGNOSIS — D696 Thrombocytopenia, unspecified: Secondary | ICD-10-CM | POA: Diagnosis not present

## 2022-12-20 DIAGNOSIS — L309 Dermatitis, unspecified: Secondary | ICD-10-CM | POA: Diagnosis not present

## 2022-12-20 DIAGNOSIS — Z1231 Encounter for screening mammogram for malignant neoplasm of breast: Secondary | ICD-10-CM | POA: Diagnosis not present

## 2022-12-20 DIAGNOSIS — Z0000 Encounter for general adult medical examination without abnormal findings: Secondary | ICD-10-CM | POA: Diagnosis not present

## 2022-12-20 DIAGNOSIS — E559 Vitamin D deficiency, unspecified: Secondary | ICD-10-CM | POA: Diagnosis not present

## 2022-12-20 DIAGNOSIS — M159 Polyosteoarthritis, unspecified: Secondary | ICD-10-CM | POA: Diagnosis not present

## 2022-12-20 DIAGNOSIS — Z1211 Encounter for screening for malignant neoplasm of colon: Secondary | ICD-10-CM | POA: Diagnosis not present

## 2022-12-20 DIAGNOSIS — M810 Age-related osteoporosis without current pathological fracture: Secondary | ICD-10-CM | POA: Diagnosis not present

## 2023-01-02 DIAGNOSIS — Z1211 Encounter for screening for malignant neoplasm of colon: Secondary | ICD-10-CM | POA: Diagnosis not present

## 2023-05-17 DIAGNOSIS — J029 Acute pharyngitis, unspecified: Secondary | ICD-10-CM | POA: Diagnosis not present

## 2023-06-13 DIAGNOSIS — M2011 Hallux valgus (acquired), right foot: Secondary | ICD-10-CM | POA: Diagnosis not present

## 2023-06-13 DIAGNOSIS — M2012 Hallux valgus (acquired), left foot: Secondary | ICD-10-CM | POA: Diagnosis not present

## 2023-06-13 DIAGNOSIS — B351 Tinea unguium: Secondary | ICD-10-CM | POA: Diagnosis not present

## 2023-06-13 DIAGNOSIS — M79674 Pain in right toe(s): Secondary | ICD-10-CM | POA: Diagnosis not present

## 2023-06-13 DIAGNOSIS — L600 Ingrowing nail: Secondary | ICD-10-CM | POA: Diagnosis not present

## 2023-06-13 DIAGNOSIS — L851 Acquired keratosis [keratoderma] palmaris et plantaris: Secondary | ICD-10-CM | POA: Diagnosis not present

## 2023-06-13 DIAGNOSIS — M79675 Pain in left toe(s): Secondary | ICD-10-CM | POA: Diagnosis not present

## 2023-06-20 DIAGNOSIS — D696 Thrombocytopenia, unspecified: Secondary | ICD-10-CM | POA: Diagnosis not present

## 2023-06-20 DIAGNOSIS — E7849 Other hyperlipidemia: Secondary | ICD-10-CM | POA: Diagnosis not present

## 2023-06-20 DIAGNOSIS — R7303 Prediabetes: Secondary | ICD-10-CM | POA: Diagnosis not present

## 2023-06-20 DIAGNOSIS — E034 Atrophy of thyroid (acquired): Secondary | ICD-10-CM | POA: Diagnosis not present

## 2023-06-27 DIAGNOSIS — R7303 Prediabetes: Secondary | ICD-10-CM | POA: Diagnosis not present

## 2023-06-27 DIAGNOSIS — E034 Atrophy of thyroid (acquired): Secondary | ICD-10-CM | POA: Diagnosis not present

## 2023-06-27 DIAGNOSIS — E559 Vitamin D deficiency, unspecified: Secondary | ICD-10-CM | POA: Diagnosis not present

## 2023-06-27 DIAGNOSIS — D696 Thrombocytopenia, unspecified: Secondary | ICD-10-CM | POA: Diagnosis not present

## 2023-06-27 DIAGNOSIS — E7849 Other hyperlipidemia: Secondary | ICD-10-CM | POA: Diagnosis not present

## 2023-06-27 DIAGNOSIS — F3342 Major depressive disorder, recurrent, in full remission: Secondary | ICD-10-CM | POA: Diagnosis not present

## 2023-08-07 DIAGNOSIS — R519 Headache, unspecified: Secondary | ICD-10-CM | POA: Diagnosis not present

## 2023-08-07 DIAGNOSIS — R0989 Other specified symptoms and signs involving the circulatory and respiratory systems: Secondary | ICD-10-CM | POA: Diagnosis not present

## 2023-08-07 DIAGNOSIS — Z03818 Encounter for observation for suspected exposure to other biological agents ruled out: Secondary | ICD-10-CM | POA: Diagnosis not present

## 2023-12-19 DIAGNOSIS — R7303 Prediabetes: Secondary | ICD-10-CM | POA: Diagnosis not present

## 2023-12-19 DIAGNOSIS — E034 Atrophy of thyroid (acquired): Secondary | ICD-10-CM | POA: Diagnosis not present

## 2023-12-19 DIAGNOSIS — E7849 Other hyperlipidemia: Secondary | ICD-10-CM | POA: Diagnosis not present

## 2023-12-19 DIAGNOSIS — D696 Thrombocytopenia, unspecified: Secondary | ICD-10-CM | POA: Diagnosis not present

## 2024-02-11 DIAGNOSIS — B9789 Other viral agents as the cause of diseases classified elsewhere: Secondary | ICD-10-CM | POA: Diagnosis not present

## 2024-02-11 DIAGNOSIS — J988 Other specified respiratory disorders: Secondary | ICD-10-CM | POA: Diagnosis not present

## 2024-02-12 DIAGNOSIS — J069 Acute upper respiratory infection, unspecified: Secondary | ICD-10-CM | POA: Diagnosis not present

## 2024-02-12 DIAGNOSIS — Z03818 Encounter for observation for suspected exposure to other biological agents ruled out: Secondary | ICD-10-CM | POA: Diagnosis not present

## 2024-03-21 DIAGNOSIS — M810 Age-related osteoporosis without current pathological fracture: Secondary | ICD-10-CM | POA: Diagnosis not present

## 2024-03-21 DIAGNOSIS — F331 Major depressive disorder, recurrent, moderate: Secondary | ICD-10-CM | POA: Diagnosis not present

## 2024-03-21 DIAGNOSIS — E7849 Other hyperlipidemia: Secondary | ICD-10-CM | POA: Diagnosis not present

## 2024-03-21 DIAGNOSIS — Z1331 Encounter for screening for depression: Secondary | ICD-10-CM | POA: Diagnosis not present

## 2024-03-21 DIAGNOSIS — R7303 Prediabetes: Secondary | ICD-10-CM | POA: Diagnosis not present

## 2024-03-21 DIAGNOSIS — E559 Vitamin D deficiency, unspecified: Secondary | ICD-10-CM | POA: Diagnosis not present

## 2024-03-21 DIAGNOSIS — D696 Thrombocytopenia, unspecified: Secondary | ICD-10-CM | POA: Diagnosis not present

## 2024-03-21 DIAGNOSIS — Z0000 Encounter for general adult medical examination without abnormal findings: Secondary | ICD-10-CM | POA: Diagnosis not present

## 2024-03-21 DIAGNOSIS — Z1211 Encounter for screening for malignant neoplasm of colon: Secondary | ICD-10-CM | POA: Diagnosis not present

## 2024-03-21 DIAGNOSIS — R748 Abnormal levels of other serum enzymes: Secondary | ICD-10-CM | POA: Diagnosis not present

## 2024-03-21 DIAGNOSIS — E034 Atrophy of thyroid (acquired): Secondary | ICD-10-CM | POA: Diagnosis not present

## 2024-03-21 DIAGNOSIS — Z23 Encounter for immunization: Secondary | ICD-10-CM | POA: Diagnosis not present

## 2024-03-25 ENCOUNTER — Other Ambulatory Visit: Payer: Self-pay | Admitting: Internal Medicine

## 2024-03-25 DIAGNOSIS — R748 Abnormal levels of other serum enzymes: Secondary | ICD-10-CM

## 2024-03-27 DIAGNOSIS — M810 Age-related osteoporosis without current pathological fracture: Secondary | ICD-10-CM | POA: Diagnosis not present

## 2024-04-03 DIAGNOSIS — Z1212 Encounter for screening for malignant neoplasm of rectum: Secondary | ICD-10-CM | POA: Diagnosis not present

## 2024-04-03 DIAGNOSIS — Z1211 Encounter for screening for malignant neoplasm of colon: Secondary | ICD-10-CM | POA: Diagnosis not present

## 2024-04-22 ENCOUNTER — Ambulatory Visit
Admission: RE | Admit: 2024-04-22 | Discharge: 2024-04-22 | Disposition: A | Discharge status: Home / Self Care | Source: Ambulatory Visit | Attending: Internal Medicine | Admitting: Internal Medicine

## 2024-04-22 DIAGNOSIS — R748 Abnormal levels of other serum enzymes: Secondary | ICD-10-CM | POA: Diagnosis not present

## 2024-04-22 DIAGNOSIS — N281 Cyst of kidney, acquired: Secondary | ICD-10-CM | POA: Diagnosis not present

## 2024-04-24 DIAGNOSIS — G5792 Unspecified mononeuropathy of left lower limb: Secondary | ICD-10-CM | POA: Diagnosis not present

## 2024-04-24 DIAGNOSIS — B351 Tinea unguium: Secondary | ICD-10-CM | POA: Diagnosis not present

## 2024-04-24 DIAGNOSIS — M79674 Pain in right toe(s): Secondary | ICD-10-CM | POA: Diagnosis not present

## 2024-04-24 DIAGNOSIS — M79675 Pain in left toe(s): Secondary | ICD-10-CM | POA: Diagnosis not present

## 2024-04-24 DIAGNOSIS — G5791 Unspecified mononeuropathy of right lower limb: Secondary | ICD-10-CM | POA: Diagnosis not present

## 2024-05-15 DIAGNOSIS — R7303 Prediabetes: Secondary | ICD-10-CM | POA: Diagnosis not present

## 2024-05-15 DIAGNOSIS — E034 Atrophy of thyroid (acquired): Secondary | ICD-10-CM | POA: Diagnosis not present

## 2024-05-15 DIAGNOSIS — M810 Age-related osteoporosis without current pathological fracture: Secondary | ICD-10-CM | POA: Diagnosis not present

## 2024-05-25 DIAGNOSIS — J029 Acute pharyngitis, unspecified: Secondary | ICD-10-CM | POA: Diagnosis not present
